# Patient Record
Sex: Female | Born: 1985 | Hispanic: Yes | Marital: Married
Health system: Southern US, Community
[De-identification: ages and names within clinical notes are randomized; demographics above are authoritative.]

## PROBLEM LIST (undated history)

## (undated) DIAGNOSIS — E079 Disorder of thyroid, unspecified: Secondary | ICD-10-CM

## (undated) DIAGNOSIS — I498 Other specified cardiac arrhythmias: Secondary | ICD-10-CM

## (undated) DIAGNOSIS — G90A Postural orthostatic tachycardia syndrome (POTS): Secondary | ICD-10-CM

---

## 2019-12-08 ENCOUNTER — Other Ambulatory Visit: Payer: Self-pay

## 2019-12-08 ENCOUNTER — Encounter (HOSPITAL_COMMUNITY): Payer: Self-pay | Admitting: Emergency Medicine

## 2019-12-08 ENCOUNTER — Emergency Department (HOSPITAL_COMMUNITY)
Admission: EM | Admit: 2019-12-08 | Discharge: 2019-12-08 | Disposition: A | Discharge status: Home / Self Care | Payer: No Typology Code available for payment source | Attending: Emergency Medicine | Admitting: Emergency Medicine

## 2019-12-08 ENCOUNTER — Emergency Department (HOSPITAL_COMMUNITY): Payer: No Typology Code available for payment source

## 2019-12-08 DIAGNOSIS — Z79899 Other long term (current) drug therapy: Secondary | ICD-10-CM | POA: Insufficient documentation

## 2019-12-08 DIAGNOSIS — E039 Hypothyroidism, unspecified: Secondary | ICD-10-CM | POA: Diagnosis not present

## 2019-12-08 DIAGNOSIS — R000 Tachycardia, unspecified: Secondary | ICD-10-CM | POA: Insufficient documentation

## 2019-12-08 HISTORY — DX: Disorder of thyroid, unspecified: E07.9

## 2019-12-08 HISTORY — DX: Other specified cardiac arrhythmias: I49.8

## 2019-12-08 HISTORY — DX: Postural orthostatic tachycardia syndrome (POTS): G90.A

## 2019-12-08 LAB — TROPONIN I (HIGH SENSITIVITY)
Troponin I (High Sensitivity): <2 ng/L (ref <18)
Troponin I (High Sensitivity): 10 ng/L (ref <18)

## 2019-12-08 LAB — BASIC METABOLIC PANEL
Anion gap: 11 (ref 5–15)
BUN: 11 mg/dL (ref 6–20)
CO2: 23 mmol/L (ref 22–32)
Calcium: 9.1 mg/dL (ref 8.9–10.3)
Chloride: 103 mmol/L (ref 98–111)
Creatinine, Ser: 1.01 mg/dL — ABNORMAL HIGH (ref 0.44–1.00)
GFR calc Af Amer: >60 mL/min (ref >60)
GFR calc non Af Amer: >60 mL/min (ref >60)
Glucose, Bld: 157 mg/dL — ABNORMAL HIGH (ref 70–99)
Potassium: 3 mmol/L — ABNORMAL LOW (ref 3.5–5.1)
Sodium: 137 mmol/L (ref 135–145)

## 2019-12-08 LAB — URINALYSIS, ROUTINE W REFLEX MICROSCOPIC
Bilirubin Urine: NEGATIVE
Glucose, UA: NEGATIVE mg/dL
Hgb urine dipstick: NEGATIVE
Ketones, ur: NEGATIVE mg/dL
Leukocytes,Ua: NEGATIVE
Nitrite: NEGATIVE
Protein, ur: NEGATIVE mg/dL
Specific Gravity, Urine: 1.003 — ABNORMAL LOW (ref 1.005–1.030)
pH: 6 (ref 5.0–8.0)

## 2019-12-08 LAB — CBC
HCT: 40.9 % (ref 36.0–46.0)
Hemoglobin: 13.4 g/dL (ref 12.0–15.0)
MCH: 29.8 pg (ref 26.0–34.0)
MCHC: 32.8 g/dL (ref 30.0–36.0)
MCV: 90.9 fL (ref 80.0–100.0)
Platelets: 320 10*3/uL (ref 150–400)
RBC: 4.5 MIL/uL (ref 3.87–5.11)
RDW: 12.9 % (ref 11.5–15.5)
WBC: 9.7 10*3/uL (ref 4.0–10.5)
nRBC: 0 % (ref 0.0–0.2)

## 2019-12-08 LAB — D-DIMER, QUANTITATIVE: D-Dimer, Quant: 0.27 ug/mL-FEU (ref 0.00–0.50)

## 2019-12-08 LAB — I-STAT BETA HCG BLOOD, ED (MC, WL, AP ONLY): I-stat hCG, quantitative: <5 m[IU]/mL (ref <5)

## 2019-12-08 LAB — TSH: TSH: 0.427 u[IU]/mL (ref 0.350–4.500)

## 2019-12-08 MED ORDER — LORAZEPAM 2 MG/ML IJ SOLN
1.0000 mg | Freq: Once | INTRAMUSCULAR | Status: AC
  Administered 2019-12-08: 1 mg via INTRAVENOUS
  Filled 2019-12-08: qty 1

## 2019-12-08 MED ORDER — MAGNESIUM OXIDE 400 (241.3 MG) MG PO TABS
800.0000 mg | ORAL_TABLET | Freq: Once | ORAL | Status: AC
  Administered 2019-12-08: 800 mg via ORAL
  Filled 2019-12-08: qty 2

## 2019-12-08 MED ORDER — POTASSIUM CHLORIDE CRYS ER 20 MEQ PO TBCR
40.0000 meq | EXTENDED_RELEASE_TABLET | Freq: Once | ORAL | Status: AC
  Administered 2019-12-08: 40 meq via ORAL
  Filled 2019-12-08: qty 2

## 2019-12-08 MED ORDER — SODIUM CHLORIDE 0.9 % IV BOLUS
1000.0000 mL | Freq: Once | INTRAVENOUS | Status: AC
  Administered 2019-12-08: 1000 mL via INTRAVENOUS

## 2019-12-08 NOTE — ED Notes (Signed)
Discharge instructions, including follow up with cardiology, dicussed with pt. Pt verbalized instructions with no questions at this time.  Pt to go home with husband.

## 2019-12-08 NOTE — ED Triage Notes (Signed)
Pt reports working and her HR went into 160s. Hx of POTs and took 25 mg metoprolol. Reports feeling anxious and a fluttering in her chest. Denies CP

## 2019-12-08 NOTE — ED Provider Notes (Signed)
[facility] Emergency Department Provider Note MRN:  [id number]  Arrival date & time: 12/08/19     Chief Complaint   Tachycardia   History of Present Illness   Danielle Floyd is a 33 y.o. year-old female with a history of pots, thyroid disease presenting to the ED with chief complaint of tachycardia.  Patient has felt her heart racing since early this morning.  Has a history of pots.  Also history of hypothyroidism, recently had her levothyroxine dosage reduced.  Denies fever or cold-like symptoms.  Denies chest pain or shortness of breath.  Denies abdominal pain, no drugs or alcohol.  Does endorse recent increased anxiety due to life stressors.  Denies SI, no HI, no AVH.  Review of Systems  A complete 10 system review of systems was obtained and all systems are negative except as noted in the HPI and PMH.   Patient's Health History    Past Medical History:  Diagnosis Date  . POTS (postural orthostatic tachycardia syndrome)   . Thyroid disease     History reviewed. No pertinent surgical history.  No family history on file.  Social History   Socioeconomic History  . Marital status: Married    Spouse name: Not on file  . Number of children: Not on file  . Years of education: Not on file  . Highest education level: Not on file  Occupational History  . Not on file  Tobacco Use  . Smoking status: Not on file  Substance and Sexual Activity  . Alcohol use: Not on file  . Drug use: Not on file  . Sexual activity: Not on file  Other Topics Concern  . Not on file  Social History Narrative  . Not on file   Social Determinants of Health   Financial Resource Strain:   . Difficulty of Paying Living Expenses: Not on file  Food Insecurity:   . Worried About Programme researcher, broadcasting/film/videounning Out of Food in the Last Year: Not on file  . Ran Out of Food in the Last Year: Not on file  Transportation Needs:   . Lack of Transportation (Medical): Not on file  . Lack of Transportation  (Non-Medical): Not on file  Physical Activity:   . Days of Exercise per Week: Not on file  . Minutes of Exercise per Session: Not on file  Stress:   . Feeling of Stress : Not on file  Social Connections:   . Frequency of Communication with Friends and Family: Not on file  . Frequency of Social Gatherings with Friends and Family: Not on file  . Attends Religious Services: Not on file  . Active Member of Clubs or Organizations: Not on file  . Attends BankerClub or Organization Meetings: Not on file  . Marital Status: Not on file  Intimate Partner Violence:   . Fear of Current or Ex-Partner: Not on file  . Emotionally Abused: Not on file  . Physically Abused: Not on file  . Sexually Abused: Not on file     Physical Exam  Vital Signs and Nursing Notes reviewed Vitals:   12/08/19 1900 12/08/19 1915  BP: 125/77 126/74  Pulse: 100 96  Resp: 18 16  Temp:    SpO2: 100% 99%    CONSTITUTIONAL: Well-appearing, NAD NEURO:  Alert and oriented x 3, no focal deficits EYES:  eyes equal and reactive ENT/NECK:  no LAD, no JVD CARDIO: Tachycardic rate, well-perfused, normal S1 and S2 PULM:  CTAB no wheezing or rhonchi GI/GU:  normal bowel sounds, non-distended, non-tender MSK/SPINE:  No gross deformities, no edema SKIN:  no rash, atraumatic PSYCH: Mildly anxious speech and behavior  Diagnostic and Interventional Summary    EKG Interpretation  Date/Time:  Monday December 08 2019 15:21:37 EST Ventricular Rate:  113 PR Interval:  130 QRS Duration: 86 QT Interval:  302 QTC Calculation: 414 R Axis:   58 Text Interpretation: Sinus tachycardia Nonspecific ST and T wave abnormality Abnormal ECG No previous ECGs available Confirmed by Kennis Carina [phone]) on 12/08/2019 4:08:11 PM      Labs Reviewed  BASIC METABOLIC PANEL - Abnormal; Notable for the following components:      Result Value   Potassium 3.0 (*)    Glucose, Bld 157 (*)    Creatinine, Ser 1.01 (*)    All other components within  normal limits  URINALYSIS, ROUTINE W REFLEX MICROSCOPIC - Abnormal; Notable for the following components:   Color, Urine STRAW (*)    Specific Gravity, Urine 1.003 (*)    All other components within normal limits  CBC  TSH  D-DIMER, QUANTITATIVE (NOT AT [facility])  I-STAT BETA HCG BLOOD, ED ([facility], [facility], [facility] ONLY)  TROPONIN I (HIGH SENSITIVITY)  TROPONIN I (HIGH SENSITIVITY)    DG Chest 2 View  Final Result      Medications  sodium chloride 0.9 % bolus 1,000 mL (0 mLs Intravenous Stopped 12/08/19 1840)  LORazepam (ATIVAN) injection 1 mg (1 mg Intravenous Given 12/08/19 1637)  potassium chloride SA (KLOR-CON) CR tablet 40 mEq (40 mEq Oral Given 12/08/19 1839)  magnesium oxide (MAG-OX) tablet 800 mg (800 mg Oral Given 12/08/19 1839)     Procedures  /  Critical Care Procedures  ED Course and Medical Decision Making  I have reviewed the triage vital signs and the nursing notes.  Pertinent labs & imaging results that were available during my care of the patient were reviewed by me and considered in my medical decision making (see below for details).     Considering anxiety, thyroid dysfunction, tachycardia related to POTS, pulmonary realism.  Screening with D-dimer, will attempt to treat the tachycardia with fluids and Ativan.  Currently with heart rate 130, normotensive, otherwise well-appearing.  6 PM update: Patient's work-up is largely reassuring, D-dimer negative, mild hypokalemia, normal chest x-ray.  Patient was deemed to be low risk according to the [facility] PE score given that she has no other risk factors for PE other than her tachycardia, she has no chest pain or shortness of breath.  She is feeling much better after Ativan.  Her heart rate tends to increase when medical professionals entered the room.  She remains mildly tachycardic between 110 and 115 while at rest.  She explains that typically with her POTS she is only tachycardic when standing or walking.  Currently takes 25 mg  Lopressor in the morning.  Does not have a cardiologist in [place] yet, recently moved from [place].  Will consider consultation with cardiology to ensure close outpatient follow-up as well as to obtain recommendations to maximize medical management.  7:20 PM update: Patient's heart rate has improved to under 100.  Patient is appropriate for discharge with close cardiology follow-up.  Elmer Sow. Pilar Plate, MD [facility] Emergency Medicine [facility] [email or web address]  Final Clinical Impressions(s) / ED Diagnoses     ICD-10-CM   1. Tachycardia  R00.0     ED Discharge Orders    None       Discharge Instructions Discussed with and  Provided to Patient:     Discharge Instructions     You were evaluated in the Emergency Department and after careful evaluation, we did not find any emergent condition requiring admission or further testing in the hospital.  Your exam/testing today is overall reassuring.  Please continue taking your home medications and follow-up closely with cardiology.  Please return to the Emergency Department if you experience any worsening of your condition.  We encourage you to follow up with a primary care provider.  Thank you for allowing [place] to be a part of your care.       Maudie Flakes, MD 12/08/19 Dorthula Perfect

## 2019-12-08 NOTE — Discharge Instructions (Addendum)
You were evaluated in the Emergency Department and after careful evaluation, we did not find any emergent condition requiring admission or further testing in the hospital.  Your exam/testing today is overall reassuring.  Please continue taking your home medications and follow-up closely with cardiology.  Please return to the Emergency Department if you experience any worsening of your condition.  We encourage you to follow up with a primary care provider.  Thank you for allowing [place] to be a part of your care.

## 2021-02-25 IMAGING — DX DG CHEST 2V
2 series · 2 of 2 positions shown · non-contrast
Comparison: None.

CLINICAL DATA: 33-year-old female with chest pain and tachycardia.
History of POTs.

EXAM:
CHEST - 2 VIEW

[w chest pa]
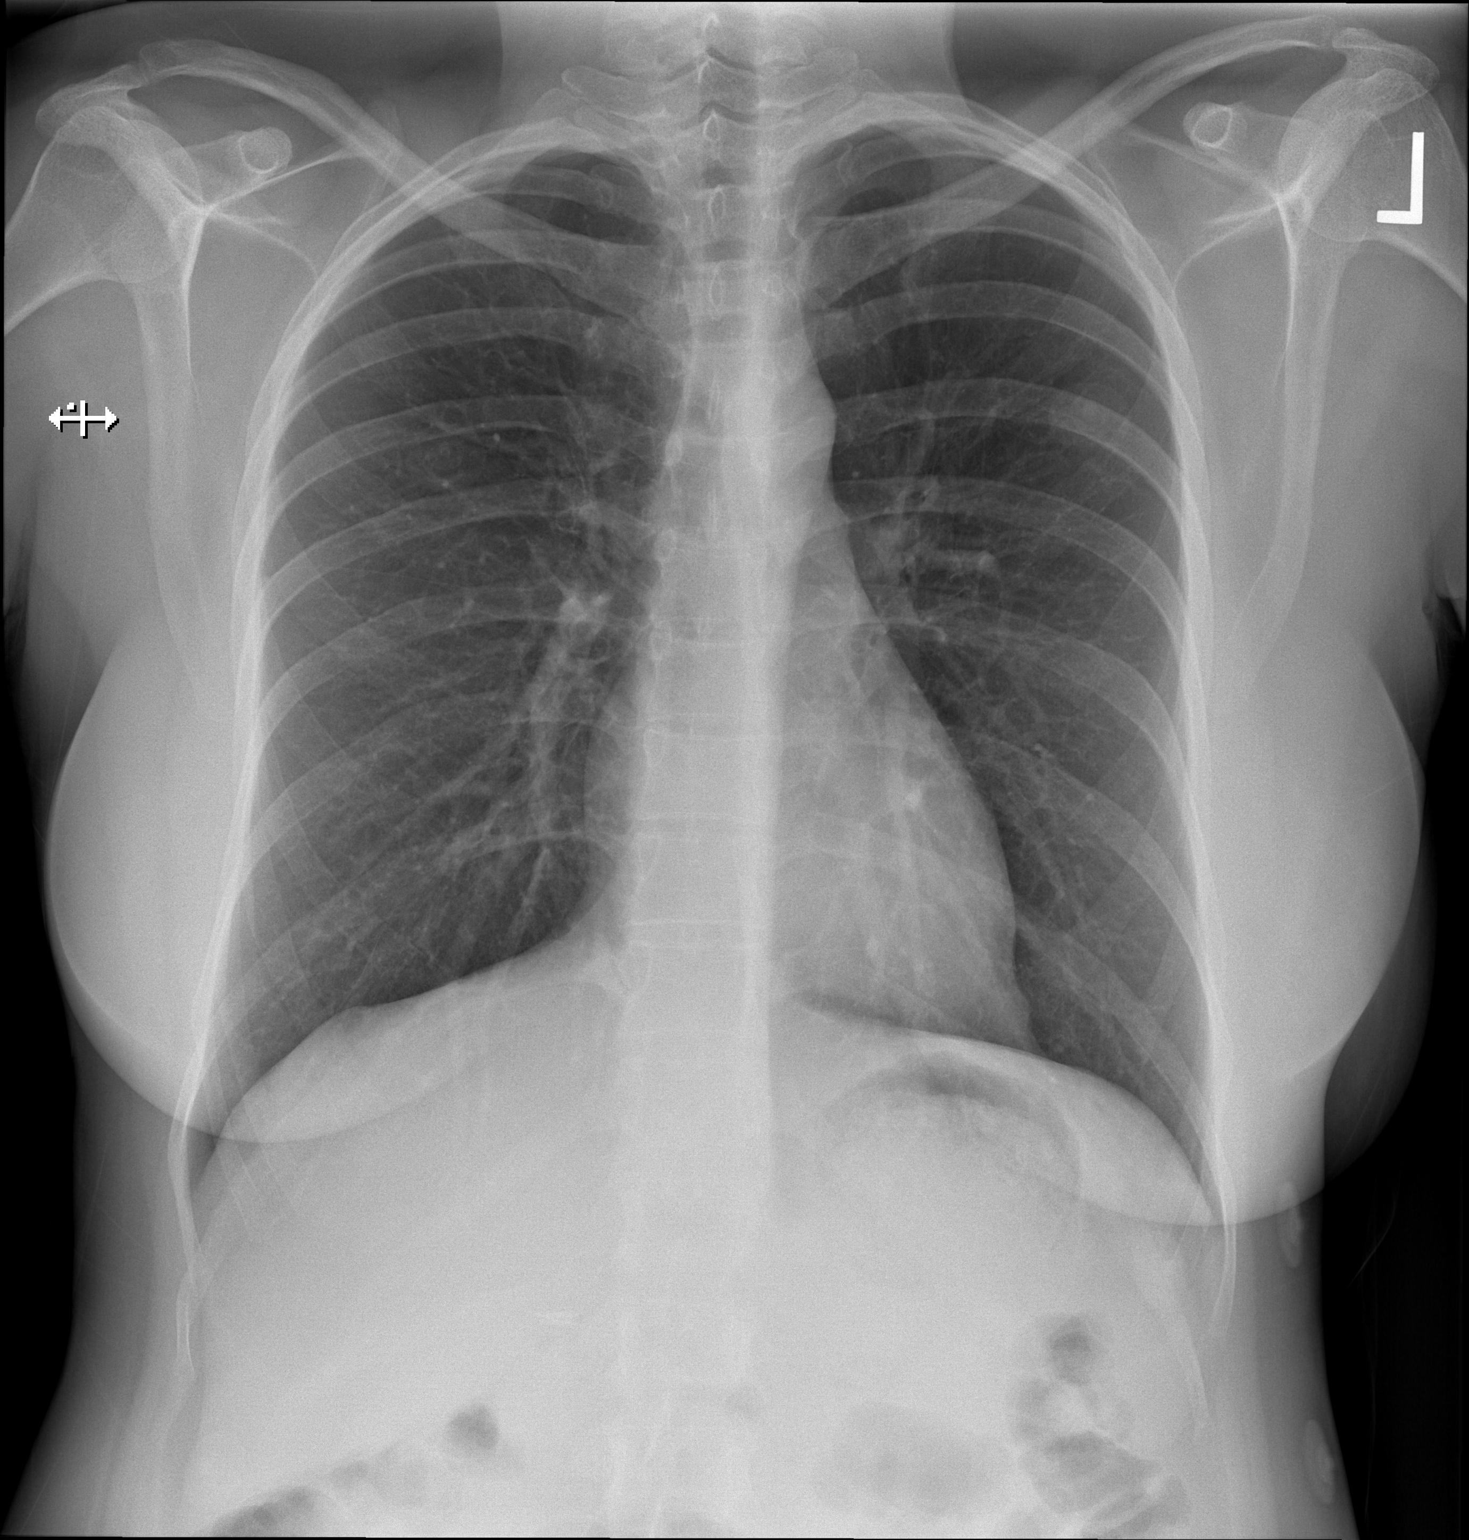

[w chest lat]
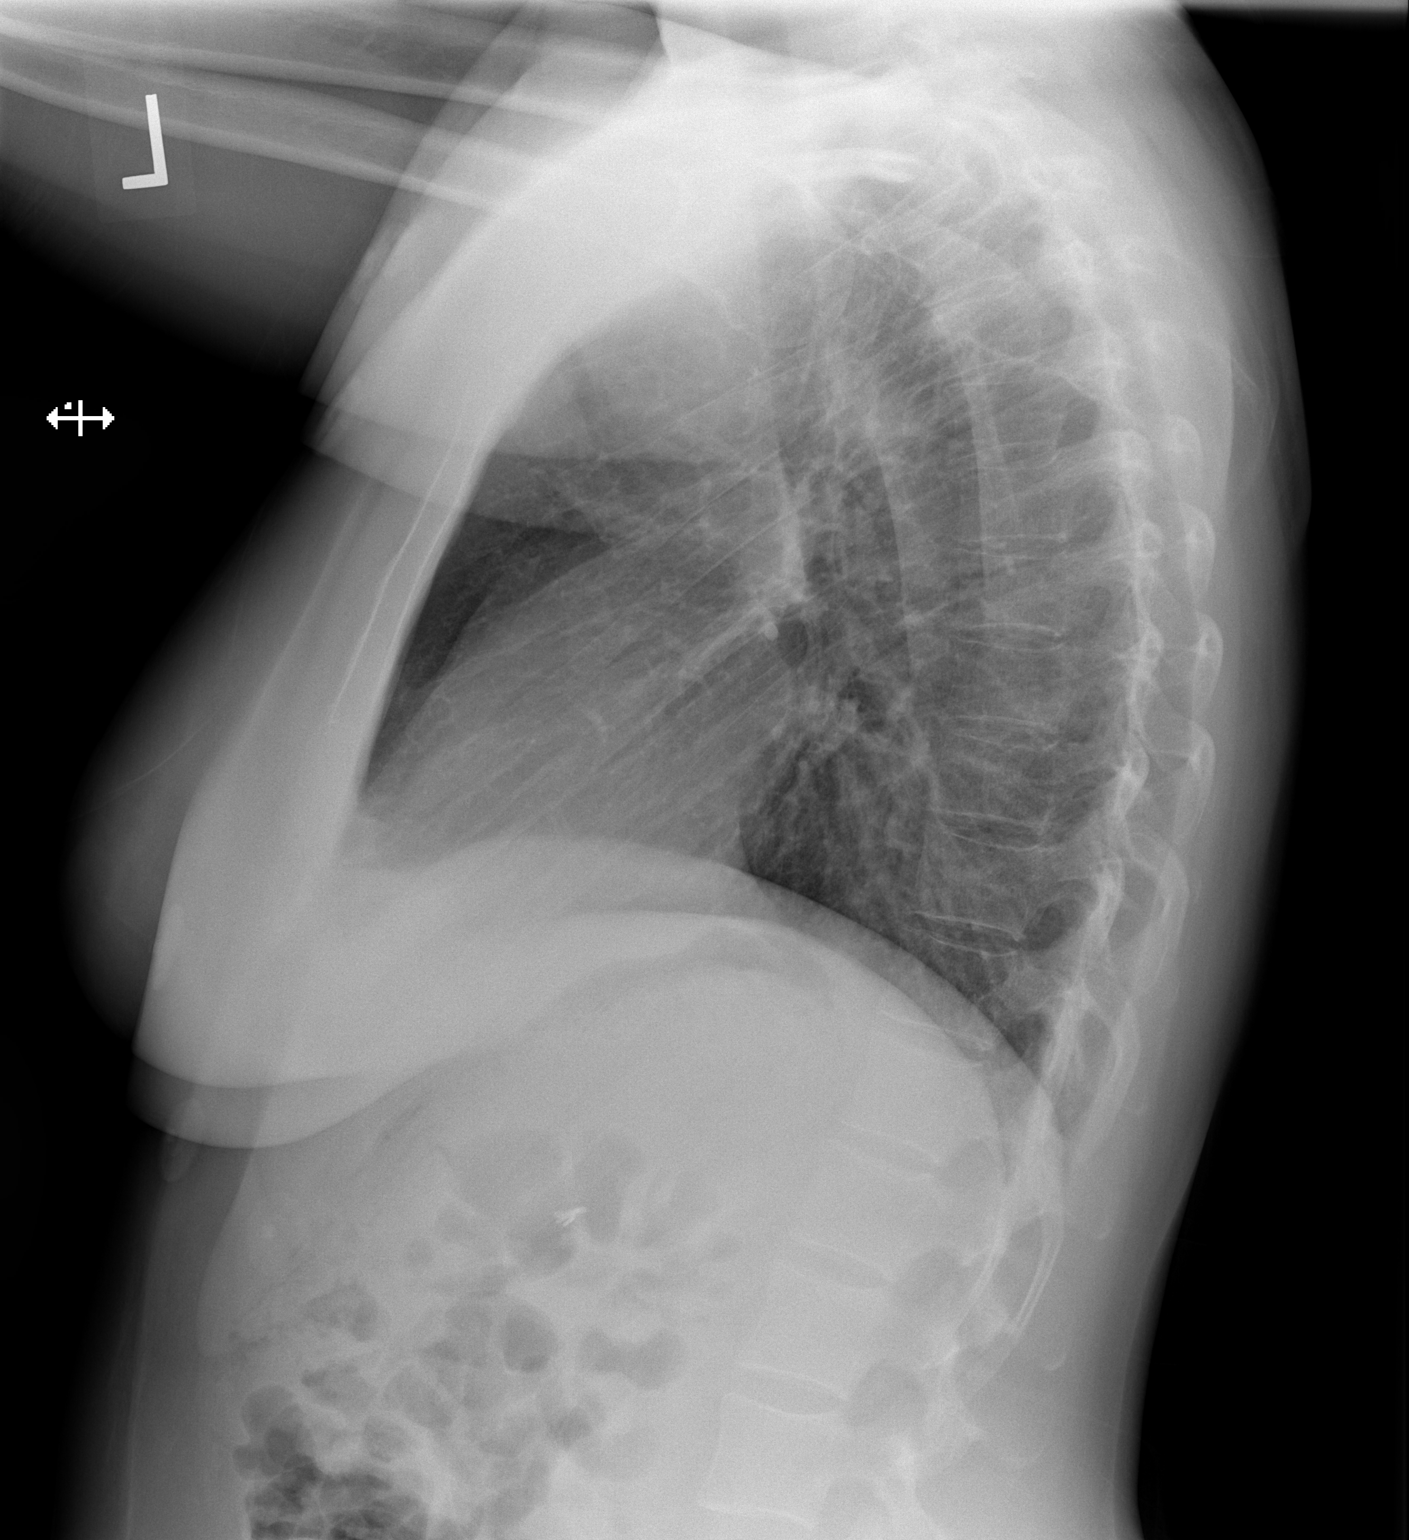

[2 of 2 positions shown; findings below may reference images not displayed]

FINDINGS: Normal lung volumes and mediastinal contours. Visualized tracheal
air column is within normal limits. EKG button artifact. Both lungs
appear clear. No pneumothorax or pleural effusion.

Right upper quadrant cholecystectomy clips. Negative visible bowel
gas. No osseous abnormality identified.
IMPRESSION: Negative.  No cardiopulmonary abnormality.
